# Patient Record
Sex: Female | Born: 1985 | Race: White | Hispanic: No | Marital: Single
Health system: Southern US, Community
[De-identification: ages and names within clinical notes are randomized; demographics above are authoritative.]

## PROBLEM LIST (undated history)

## (undated) DIAGNOSIS — C801 Malignant (primary) neoplasm, unspecified: Secondary | ICD-10-CM

## (undated) HISTORY — DX: Malignant (primary) neoplasm, unspecified: C80.1

---

## 2020-03-01 ENCOUNTER — Other Ambulatory Visit: Payer: Self-pay

## 2020-03-01 ENCOUNTER — Emergency Department (HOSPITAL_COMMUNITY): Payer: 59

## 2020-03-01 ENCOUNTER — Encounter (HOSPITAL_COMMUNITY): Payer: Self-pay

## 2020-03-01 ENCOUNTER — Emergency Department (HOSPITAL_COMMUNITY)
Admission: EM | Admit: 2020-03-01 | Discharge: 2020-03-01 | Disposition: A | Discharge status: Home / Self Care | Payer: 59 | Attending: Emergency Medicine | Admitting: Emergency Medicine

## 2020-03-01 DIAGNOSIS — N390 Urinary tract infection, site not specified: Secondary | ICD-10-CM | POA: Insufficient documentation

## 2020-03-01 DIAGNOSIS — R1032 Left lower quadrant pain: Secondary | ICD-10-CM | POA: Diagnosis not present

## 2020-03-01 DIAGNOSIS — M542 Cervicalgia: Secondary | ICD-10-CM | POA: Insufficient documentation

## 2020-03-01 DIAGNOSIS — R519 Headache, unspecified: Secondary | ICD-10-CM | POA: Diagnosis not present

## 2020-03-01 DIAGNOSIS — M545 Low back pain: Secondary | ICD-10-CM | POA: Insufficient documentation

## 2020-03-01 DIAGNOSIS — R0789 Other chest pain: Secondary | ICD-10-CM | POA: Insufficient documentation

## 2020-03-01 DIAGNOSIS — Y9389 Activity, other specified: Secondary | ICD-10-CM | POA: Diagnosis not present

## 2020-03-01 DIAGNOSIS — Y92410 Unspecified street and highway as the place of occurrence of the external cause: Secondary | ICD-10-CM | POA: Insufficient documentation

## 2020-03-01 DIAGNOSIS — R109 Unspecified abdominal pain: Secondary | ICD-10-CM | POA: Diagnosis present

## 2020-03-01 DIAGNOSIS — T1490XA Injury, unspecified, initial encounter: Secondary | ICD-10-CM

## 2020-03-01 LAB — URINALYSIS, ROUTINE W REFLEX MICROSCOPIC
Bilirubin Urine: NEGATIVE
Glucose, UA: NEGATIVE mg/dL
Ketones, ur: NEGATIVE mg/dL
Leukocytes,Ua: NEGATIVE
Nitrite: POSITIVE — AB
Protein, ur: NEGATIVE mg/dL
Specific Gravity, Urine: 1.01 (ref 1.005–1.030)
pH: 8 (ref 5.0–8.0)

## 2020-03-01 LAB — CBC WITH DIFFERENTIAL/PLATELET
Abs Immature Granulocytes: 0.02 10*3/uL (ref 0.00–0.07)
Basophils Absolute: 0 10*3/uL (ref 0.0–0.1)
Basophils Relative: 0 %
Eosinophils Absolute: 0 10*3/uL (ref 0.0–0.5)
Eosinophils Relative: 1 %
HCT: 49.2 % — ABNORMAL HIGH (ref 36.0–46.0)
Hemoglobin: 15.9 g/dL — ABNORMAL HIGH (ref 12.0–15.0)
Immature Granulocytes: 0 %
Lymphocytes Relative: 22 %
Lymphs Abs: 1.9 10*3/uL (ref 0.7–4.0)
MCH: 30.2 pg (ref 26.0–34.0)
MCHC: 32.3 g/dL (ref 30.0–36.0)
MCV: 93.4 fL (ref 80.0–100.0)
Monocytes Absolute: 0.6 10*3/uL (ref 0.1–1.0)
Monocytes Relative: 7 %
Neutro Abs: 5.8 10*3/uL (ref 1.7–7.7)
Neutrophils Relative %: 70 %
Platelets: 295 10*3/uL (ref 150–400)
RBC: 5.27 MIL/uL — ABNORMAL HIGH (ref 3.87–5.11)
RDW: 11.9 % (ref 11.5–15.5)
WBC: 8.4 10*3/uL (ref 4.0–10.5)
nRBC: 0 % (ref 0.0–0.2)

## 2020-03-01 LAB — I-STAT BETA HCG BLOOD, ED (MC, WL, AP ONLY): I-stat hCG, quantitative: <5 m[IU]/mL (ref <5)

## 2020-03-01 LAB — I-STAT CHEM 8, ED
BUN: 9 mg/dL (ref 6–20)
Calcium, Ion: 1.13 mmol/L — ABNORMAL LOW (ref 1.15–1.40)
Chloride: 102 mmol/L (ref 98–111)
Creatinine, Ser: 0.7 mg/dL (ref 0.44–1.00)
Glucose, Bld: 107 mg/dL — ABNORMAL HIGH (ref 70–99)
HCT: 49 % — ABNORMAL HIGH (ref 36.0–46.0)
Hemoglobin: 16.7 g/dL — ABNORMAL HIGH (ref 12.0–15.0)
Potassium: 3.5 mmol/L (ref 3.5–5.1)
Sodium: 139 mmol/L (ref 135–145)
TCO2: 24 mmol/L (ref 22–32)

## 2020-03-01 MED ORDER — LORAZEPAM 2 MG/ML IJ SOLN
1.0000 mg | Freq: Once | INTRAMUSCULAR | Status: AC
  Administered 2020-03-01: 1 mg via INTRAVENOUS
  Filled 2020-03-01: qty 1

## 2020-03-01 MED ORDER — CYCLOBENZAPRINE HCL 10 MG PO TABS: 10.0000 mg | ORAL_TABLET | Freq: Two times a day (BID) | ORAL | 0 refills | Status: AC | PRN

## 2020-03-01 MED ORDER — HYDROMORPHONE HCL 1 MG/ML IJ SOLN
1.0000 mg | Freq: Once | INTRAMUSCULAR | Status: AC
  Administered 2020-03-01: 1 mg via INTRAVENOUS
  Filled 2020-03-01: qty 1

## 2020-03-01 MED ORDER — IBUPROFEN 600 MG PO TABS: 600.0000 mg | ORAL_TABLET | Freq: Four times a day (QID) | ORAL | 0 refills | Status: AC | PRN

## 2020-03-01 MED ORDER — CEPHALEXIN 500 MG PO CAPS: 500.0000 mg | ORAL_CAPSULE | Freq: Four times a day (QID) | ORAL | 0 refills | Status: AC

## 2020-03-01 MED ORDER — IOHEXOL 300 MG/ML  SOLN
100.0000 mL | Freq: Once | INTRAMUSCULAR | Status: AC | PRN
  Administered 2020-03-01: 100 mL via INTRAVENOUS

## 2020-03-01 MED ORDER — MORPHINE SULFATE (PF) 4 MG/ML IV SOLN
4.0000 mg | Freq: Once | INTRAVENOUS | Status: AC
  Administered 2020-03-01: 4 mg via INTRAVENOUS
  Filled 2020-03-01: qty 1
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
## 2020-03-01 NOTE — ED Notes (Signed)
I/O not preformed
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
## 2020-03-01 NOTE — ED Notes (Signed)
Wasted Ativan 1mg  with Sammuel Bailiff.  When this nurse and Sammuel Bailiff went to waste in pyxis, pt did not show up.
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
